# Patient Record
Sex: Female | Born: 1975 | Hispanic: Yes | Marital: Married | Smoking: Never smoker
Health system: Southern US, Community
[De-identification: ages and names within clinical notes are randomized; demographics above are authoritative.]

## PROBLEM LIST (undated history)

## (undated) DIAGNOSIS — F419 Anxiety disorder, unspecified: Secondary | ICD-10-CM

## (undated) DIAGNOSIS — F32A Depression, unspecified: Secondary | ICD-10-CM

## (undated) DIAGNOSIS — T7840XA Allergy, unspecified, initial encounter: Secondary | ICD-10-CM

## (undated) DIAGNOSIS — F329 Major depressive disorder, single episode, unspecified: Secondary | ICD-10-CM

## (undated) HISTORY — DX: Major depressive disorder, single episode, unspecified: F32.9

## (undated) HISTORY — DX: Anxiety disorder, unspecified: F41.9

## (undated) HISTORY — DX: Depression, unspecified: F32.A

## (undated) HISTORY — DX: Allergy, unspecified, initial encounter: T78.40XA

---

## 2015-03-07 ENCOUNTER — Encounter (HOSPITAL_COMMUNITY): Payer: Self-pay | Admitting: *Deleted

## 2015-03-07 ENCOUNTER — Emergency Department (HOSPITAL_COMMUNITY)
Admission: EM | Admit: 2015-03-07 | Discharge: 2015-03-07 | Disposition: A | Discharge status: Home / Self Care | Payer: No Typology Code available for payment source | Attending: Emergency Medicine | Admitting: Emergency Medicine

## 2015-03-07 DIAGNOSIS — S199XXA Unspecified injury of neck, initial encounter: Secondary | ICD-10-CM | POA: Insufficient documentation

## 2015-03-07 DIAGNOSIS — Y9389 Activity, other specified: Secondary | ICD-10-CM | POA: Insufficient documentation

## 2015-03-07 DIAGNOSIS — S3992XA Unspecified injury of lower back, initial encounter: Secondary | ICD-10-CM | POA: Insufficient documentation

## 2015-03-07 DIAGNOSIS — S0990XA Unspecified injury of head, initial encounter: Secondary | ICD-10-CM | POA: Insufficient documentation

## 2015-03-07 DIAGNOSIS — Y998 Other external cause status: Secondary | ICD-10-CM | POA: Insufficient documentation

## 2015-03-07 DIAGNOSIS — Y92410 Unspecified street and highway as the place of occurrence of the external cause: Secondary | ICD-10-CM | POA: Insufficient documentation

## 2015-03-07 MED ORDER — NAPROXEN 500 MG PO TABS: 500.0000 mg | ORAL_TABLET | Freq: Two times a day (BID) | ORAL | Status: DC

## 2015-03-07 MED ORDER — METHOCARBAMOL 500 MG PO TABS: 500.0000 mg | ORAL_TABLET | Freq: Two times a day (BID) | ORAL | Status: DC

## 2015-03-07 NOTE — ED Notes (Signed)
Pt A&OX4, ambulatory at d/c with steady gait, NAD 
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
## 2015-05-20 ENCOUNTER — Encounter (HOSPITAL_COMMUNITY): Payer: Self-pay | Admitting: Emergency Medicine

## 2015-05-20 ENCOUNTER — Emergency Department (HOSPITAL_COMMUNITY)
Admission: EM | Admit: 2015-05-20 | Discharge: 2015-05-20 | Disposition: A | Discharge status: Home / Self Care | Payer: No Typology Code available for payment source | Attending: Emergency Medicine | Admitting: Emergency Medicine

## 2015-05-20 DIAGNOSIS — M545 Low back pain: Secondary | ICD-10-CM | POA: Diagnosis not present

## 2015-05-20 DIAGNOSIS — Z791 Long term (current) use of non-steroidal anti-inflammatories (NSAID): Secondary | ICD-10-CM | POA: Diagnosis not present

## 2015-05-20 DIAGNOSIS — M542 Cervicalgia: Secondary | ICD-10-CM | POA: Diagnosis not present

## 2015-05-20 DIAGNOSIS — M546 Pain in thoracic spine: Secondary | ICD-10-CM | POA: Diagnosis not present

## 2015-05-20 MED ORDER — DICLOFENAC SODIUM 75 MG PO TBEC: 75.0000 mg | DELAYED_RELEASE_TABLET | Freq: Two times a day (BID) | ORAL | Status: DC

## 2015-05-20 MED ORDER — METHOCARBAMOL 500 MG PO TABS: 500.0000 mg | ORAL_TABLET | Freq: Two times a day (BID) | ORAL | Status: DC

## 2015-05-20 NOTE — Discharge Instructions (Signed)
Back Pain, Adult Low back pain is very common. About 1 in 5 people have back pain.The cause of low back pain is rarely dangerous. The pain often gets better over time.About half of people with a sudden onset of back pain feel better in just 2 weeks. About 8 in 10 people feel better by 6 weeks.  CAUSES Some common causes of back pain include:  Strain of the muscles or ligaments supporting the spine.  Wear and tear (degeneration) of the spinal discs.  Arthritis.  Direct injury to the back. DIAGNOSIS Most of the time, the direct cause of low back pain is not known.However, back pain can be treated effectively even when the exact cause of the pain is unknown.Answering your caregiver's questions about your overall health and symptoms is one of the most accurate ways to make sure the cause of your pain is not dangerous. If your caregiver needs more information, he or she may order lab work or imaging tests (X-rays or MRIs).However, even if imaging tests show changes in your back, this usually does not require surgery. HOME CARE INSTRUCTIONS For many people, back pain returns.Since low back pain is rarely dangerous, it is often a condition that people can learn to manageon their own.   Remain active. It is stressful on the back to sit or stand in one place. Do not sit, drive, or stand in one place for more than 30 minutes at a time. Take short walks on level surfaces as soon as pain allows.Try to increase the length of time you walk each day.  Do not stay in bed.Resting more than 1 or 2 days can delay your recovery.  Do not avoid exercise or work.Your body is made to move.It is not dangerous to be active, even though your back may hurt.Your back will likely heal faster if you return to being active before your pain is gone.  Pay attention to your body when you bend and lift. Many people have less discomfortwhen lifting if they bend their knees, keep the load close to their bodies,and  avoid twisting. Often, the most comfortable positions are those that put less stress on your recovering back.  Find a comfortable position to sleep. Use a firm mattress and lie on your side with your knees slightly bent. If you lie on your back, put a pillow under your knees.  Only take over-the-counter or prescription medicines as directed by your caregiver. Over-the-counter medicines to reduce pain and inflammation are often the most helpful.Your caregiver may prescribe muscle relaxant drugs.These medicines help dull your pain so you can more quickly return to your normal activities and healthy exercise.  Put ice on the injured area.  Put ice in a plastic bag.  Place a towel between your skin and the bag.  Leave the ice on for 15-20 minutes, 03-04 times a day for the first 2 to 3 days. After that, ice and heat may be alternated to reduce pain and spasms.  Ask your caregiver about trying back exercises and gentle massage. This may be of some benefit.  Avoid feeling anxious or stressed.Stress increases muscle tension and can worsen back pain.It is important to recognize when you are anxious or stressed and learn ways to manage it.Exercise is a great option. SEEK MEDICAL CARE IF:  You have pain that is not relieved with rest or medicine.  You have pain that does not improve in 1 week.  You have new symptoms.  You are generally not feeling well. SEEK   IMMEDIATE MEDICAL CARE IF:   You have pain that radiates from your back into your legs.  You develop new bowel or bladder control problems.  You have unusual weakness or numbness in your arms or legs.  You develop nausea or vomiting.  You develop abdominal pain.  You feel faint. Document Released: 10/25/2005 Document Revised: 04/25/2012 Document Reviewed: 02/26/2014 ExitCare Patient Information 2015 ExitCare, LLC. This information is not intended to replace advice given to you by your health care provider. Make sure you  discuss any questions you have with your health care provider.  
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
## 2015-06-18 ENCOUNTER — Ambulatory Visit: Payer: No Typology Code available for payment source | Attending: Orthopaedic Surgery

## 2015-06-18 DIAGNOSIS — M546 Pain in thoracic spine: Secondary | ICD-10-CM | POA: Diagnosis present

## 2015-06-18 DIAGNOSIS — M2569 Stiffness of other specified joint, not elsewhere classified: Secondary | ICD-10-CM

## 2015-06-18 DIAGNOSIS — M2560 Stiffness of unspecified joint, not elsewhere classified: Secondary | ICD-10-CM | POA: Diagnosis present

## 2015-06-18 NOTE — Patient Instructions (Signed)
BACK: Child's Pose (Sciatica)   Sintese en la posicin rodillas al pecho y extienda los brazos hacia adelante. Separe las rodillas para mas comodidad. Sostenga la posicin durante 20___ respiraciones. Repita _3__ veces. Realice _3__ veces al da.    Side Waist Stretch from Child's Pose  From child's pose, walk hands to left. Reach right hand out on diagonal. Reach hips back toward heels making a C with torso. Breathe into right side waist. Hold for _20___ breaths. Repeat _3___ times each side.  1-2 times a day.  Copyright  VHI. All rights reserved.   [facility] [address] [place], [place] [address] Phone # [phone] Fax [phone]
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
## 2015-06-19 ENCOUNTER — Ambulatory Visit: Payer: No Typology Code available for payment source | Admitting: Physical Therapy

## 2015-06-19 ENCOUNTER — Encounter: Payer: Self-pay | Admitting: Physical Therapy

## 2015-06-19 DIAGNOSIS — M546 Pain in thoracic spine: Secondary | ICD-10-CM | POA: Diagnosis not present

## 2015-06-19 DIAGNOSIS — M2569 Stiffness of other specified joint, not elsewhere classified: Secondary | ICD-10-CM

## 2015-06-19 DIAGNOSIS — M2560 Stiffness of unspecified joint, not elsewhere classified: Secondary | ICD-10-CM
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
## 2015-06-19 NOTE — Patient Instructions (Addendum)
Thoracic Self-Mobilization (Sitting)   With small rolled towel at lower ribs level, gently lean back until stretch is felt. Hold 5 seconds. Relax. Repeat   10 times per set. Do   3 sets per session. Do  2 -3  sessions per day.  http://orth.exer.us/998   Copyright  VHI. All rights reserved.  Thoracic Self-Mobilization Stretch (Supine)   With small rolled towel at lower ribs level, (in BRA area) gently lie back until stretch is felt. Hold  . Relax. Repeat  X 1 times per set. Do 2 x per day. Have your arms at the side.  http://orth.exer.us/994   Copyright  VHI. All rights reserved.   Cervico-Thoracic: Extension / Rotation (Sitting)   Reach across body with left arm and grasp back of chair. Gently look over right side shoulder. Hold __20__ seconds. Relax. Repeat ___3_ times per set. Do _1___ sets per session. Do __3__ sessions per day.  Copyright  VHI. All rights reserved.     Lumbar Rotation: Caudal - Bilateral (Supine)   Feet and knees together, arms outstretched, rotate knees left, turning head in opposite direction, until stretch is felt. Hold _20___ seconds. Relax. Repeat __3__ times per set. Do _1___ sets per session. Do _3___ sessions per day.  http://orth.exer.us/1020   Copyright  VHI. All rights reserved.

## 2015-06-23 ENCOUNTER — Ambulatory Visit: Payer: No Typology Code available for payment source | Admitting: Physical Therapy

## 2015-06-30 ENCOUNTER — Ambulatory Visit: Payer: No Typology Code available for payment source

## 2015-06-30 DIAGNOSIS — M2560 Stiffness of unspecified joint, not elsewhere classified: Secondary | ICD-10-CM

## 2015-06-30 DIAGNOSIS — M2569 Stiffness of other specified joint, not elsewhere classified: Secondary | ICD-10-CM

## 2015-06-30 DIAGNOSIS — M546 Pain in thoracic spine: Secondary | ICD-10-CM | POA: Diagnosis not present

## 2015-06-30 NOTE — Therapy (Signed)
[facility] [address] [place], [place], [address] Phone: [phone]   Fax:  [phone]  Physical Therapy Treatment  Patient Details  Name: Nishat Livingston MRN: [id number] Date of Birth: [birth year] Referring Provider:  Tarry Kos, MD  Encounter Date: 06/30/2015      PT End of Session - 06/30/15 0834    Visit Number 3   Date for PT Re-Evaluation 08/13/15   PT Start Time 0800   PT Stop Time 0855   PT Time Calculation (min) 55 min   Activity Tolerance Patient tolerated treatment well   Behavior During Therapy [facility] for tasks assessed/performed      History reviewed. No pertinent past medical history.  History reviewed. No pertinent past surgical history.  There were no vitals filed for this visit.  Visit Diagnosis:  Bilateral thoracic back pain  Back stiffness      Subjective Assessment - 06/30/15 0804    Subjective Exercises are going well.  Muscles in the thoracic spine feel more relaxed now.     Patient is accompained by: Interpreter   Currently in Pain? Yes   Pain Score 2    Pain Location Back   Pain Orientation Right;Left;Mid   Pain Descriptors / Indicators Sore;Tingling   Pain Type Chronic pain   Pain Onset More than a month ago   Pain Frequency Constant   Aggravating Factors  work Nature conservation officer), cooking   Pain Relieving Factors medication, sitting upright                         [facility] Adult PT Treatment/Exercise - 06/30/15 0001    Lumbar Exercises: Stretches   Lower Trunk Rotation 3 reps;20 seconds  each side   Lumbar Exercises: Aerobic   UBE (Upper Arm Bike) Level 1 seated with good posture x 6 minutes (3/3)   Lumbar Exercises: Supine   Other Supine Lumbar Exercises Foam roll x 3 min   Lumbar Exercises: Prone   Other Prone Lumbar Exercises Prayer and lateral prayer stretch 3x20 seconds each   Shoulder Exercises: Seated   Horizontal ABduction Strengthening;Both;20 reps   Theraband Level (Shoulder Horizontal ABduction) Level 1 (Yellow)   Other Seated Exercises thoracolumbar flexibility in chair 3x20 seconds   Modalities   Modalities Moist Heat   Moist Heat Therapy   Number Minutes Moist Heat 15 Minutes   Moist Heat Location Other (comment)  thoracic   Manual Therapy   Manual Therapy Joint mobilization   Joint Mobilization PA glides T3-T10 grade II-III to pt tolerance                  PT Short Term Goals - 06/30/15 0805    PT SHORT TERM GOAL #1   Title be independent in intial HEP   Time 4   Period Weeks   Status Achieved   PT SHORT TERM GOAL #2   Title report a 30% reduction in thoracic pain with work tasks   Time 4   Period Weeks   Status On-going  10% reduction           PT Long Term Goals - 06/18/15 5284    PT LONG TERM GOAL #1   Title be independent in advanced HEP   Time 8   Period Weeks   Status New   PT LONG TERM GOAL #2   Title reduce FOTO to < or = to 29% limitation   Time 8   Period  Weeks   Status New   PT LONG TERM GOAL #3   Title report a 70% reduction in thoracic pain with work tasks   Time 8   Period Weeks   Status New   PT LONG TERM GOAL #4   Title cook with < or = to 3/10 thoracic pain   Time 8   Period Weeks   Status New               Plan - 06/30/15 1610    Clinical Impression Statement Pt reports 10% reduction in overall pain and reports that muscles in mid-back feel more relaxed.  Pt with continued thoracic pain especially with work tasks.  Pt demonstrates stiffness in the thoracic spine and painful mobility.  Pt will benefit from skilled PT for spinal decompression, flexibility, postural strength and pain management as needed.     Pt will benefit from skilled therapeutic intervention in order to improve on the following deficits Pain;Increased muscle spasms;Impaired flexibility   Rehab Potential Good   PT Frequency 2x / week   PT Duration 8 weeks   PT Treatment/Interventions ADLs/Self  Care Home Management;Cryotherapy;Electrical Stimulation;Functional mobility training;Ultrasound;Moist Heat;Therapeutic activities;Therapeutic exercise;Neuromuscular re-education;Patient/family education;Passive range of motion;Manual techniques   PT Next Visit Plan PA glides to thoracic spine, demcompression, thoracic strength and flexibility, modalities PRN   Consulted and Agree with Plan of Care Patient        Problem List There are no active problems to display for this patient.   TAKACS,KELLY, PT 06/30/2015, 8:39 AM  [facility] [address] [place], [place], [address] Phone: [phone]   Fax:  [phone]

## 2015-07-02 ENCOUNTER — Ambulatory Visit: Payer: No Typology Code available for payment source

## 2015-07-02 DIAGNOSIS — M546 Pain in thoracic spine: Secondary | ICD-10-CM | POA: Diagnosis not present

## 2015-07-02 DIAGNOSIS — M2560 Stiffness of unspecified joint, not elsewhere classified: Secondary | ICD-10-CM

## 2015-07-02 DIAGNOSIS — M2569 Stiffness of other specified joint, not elsewhere classified: Secondary | ICD-10-CM
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
## 2015-07-02 NOTE — Patient Instructions (Addendum)
Do this seated on on your back (easier on your back)    PNF Strengthening: Resisted   Standing with resistive band around each hand, bring right arm up and away, thumb back. Repeat _10___ times per set. Do _2___ sets per session. Do _1-2___ sessions per day.      Resisted Horizontal Abduction: Bilateral   Sit or stand, tubing in both hands, arms out in front. Keeping arms straight, pinch shoulder blades together and stretch arms out. Repeat _10___ times per set. Do 2____ sets per session. Do _1-2___ sessions per day.   [facility] [address] [place], [place] [address] Phone # [phone] Fax [phone]

## 2015-07-07 ENCOUNTER — Ambulatory Visit: Payer: No Typology Code available for payment source | Admitting: Physical Therapy

## 2015-07-07 ENCOUNTER — Encounter: Payer: Self-pay | Admitting: Physical Therapy

## 2015-07-07 DIAGNOSIS — M2560 Stiffness of unspecified joint, not elsewhere classified: Secondary | ICD-10-CM

## 2015-07-07 DIAGNOSIS — M2569 Stiffness of other specified joint, not elsewhere classified: Secondary | ICD-10-CM

## 2015-07-07 DIAGNOSIS — M546 Pain in thoracic spine: Secondary | ICD-10-CM
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
## 2015-07-10 ENCOUNTER — Ambulatory Visit: Payer: No Typology Code available for payment source | Attending: Orthopaedic Surgery

## 2015-07-10 DIAGNOSIS — M2560 Stiffness of unspecified joint, not elsewhere classified: Secondary | ICD-10-CM | POA: Diagnosis present

## 2015-07-10 DIAGNOSIS — M2569 Stiffness of other specified joint, not elsewhere classified: Secondary | ICD-10-CM

## 2015-07-10 DIAGNOSIS — M546 Pain in thoracic spine: Secondary | ICD-10-CM | POA: Diagnosis present

## 2015-07-10 NOTE — Therapy (Signed)
[facility] [address], [address] [place], [place], [address] Phone: [phone]   Fax:  [phone]  Physical Therapy Treatment  Patient Details  Name: Connie Li MRN: [id number] Date of Birth: [birth year] Referring Provider:  Tarry Kos, MD  Encounter Date: 07/10/2015      PT End of Session - 07/10/15 0920    Visit Number 6   Date for PT Re-Evaluation 08/13/15   PT Start Time 0846   PT Stop Time 0918   PT Time Calculation (min) 32 min   Activity Tolerance Patient tolerated treatment well   Behavior During Therapy [facility] for tasks assessed/performed      History reviewed. No pertinent past medical history.  History reviewed. No pertinent past surgical history.  There were no vitals filed for this visit.  Visit Diagnosis:  Bilateral thoracic back pain  Back stiffness      Subjective Assessment - 07/10/15 0857    Subjective No pain today.  Pt reports >90% improvement in symptoms since the start of care.     Patient is accompained by: Interpreter   Currently in Pain? No/denies                         [facility] Adult PT Treatment/Exercise - 07/10/15 0001    Lumbar Exercises: Aerobic   UBE (Upper Arm Bike) Level 1 seated with good posture x 8 minutes (4/4)   Lumbar Exercises: Supine   Other Supine Lumbar Exercises Foam roll x 3 min   Shoulder Exercises: Seated   Horizontal ABduction Strengthening;Both;20 reps   Theraband Level (Shoulder Horizontal ABduction) Level 2 (Red)   Flexion Strengthening;Both;20 reps;Weights   Flexion Weight (lbs) 1   Abduction Strengthening;20 reps;Weights  scaption 1# x 10   ABduction Weight (lbs) 1   Other Seated Exercises D2 with red band 2x10 each   Manual Therapy   Manual Therapy Joint mobilization   Joint Mobilization PA glides T3-T10 grade II-III to pt tolerance                  PT Short Term Goals - 07/07/15 0854    PT SHORT TERM GOAL #1   Title be  independent in intial HEP   Time 4   Period Weeks   Status Achieved   PT SHORT TERM GOAL #2   Title report a 30% reduction in thoracic pain with work tasks   Time 4   Period Weeks   Status On-going           PT Long Term Goals - 07/07/15 0856    PT LONG TERM GOAL #1   Title be independent in advanced HEP   Time 8   Period Weeks   Status On-going   PT LONG TERM GOAL #2   Title reduce FOTO to < or = to 29% limitation   Time 8   Period Weeks   Status On-going   PT LONG TERM GOAL #3   Title report a 70% reduction in thoracic pain with work tasks   Time 8   Period Weeks   Status On-going   PT LONG TERM GOAL #4   Title cook with < or = to 3/10 thoracic pain   Time 8   Period Weeks   Status On-going               Plan - 07/10/15 0903    Clinical Impression Statement Pt denies pain over the past  2 days.  Pt reports >90% improvement in symptoms since the start of care.  Pt was able to tolerate increased time on the arm bike and increased resistance with theraband with horizontal abduction and D2.  Pt with continued stiffness in the thoracic spine.  Pt will benefit from PT for strength and flexibility.     Pt will benefit from skilled therapeutic intervention in order to improve on the following deficits Pain;Increased muscle spasms;Impaired flexibility   Rehab Potential Good   PT Frequency 2x / week   PT Duration 8 weeks   PT Treatment/Interventions ADLs/Self Care Home Management;Cryotherapy;Electrical Stimulation;Functional mobility training;Ultrasound;Moist Heat;Therapeutic activities;Therapeutic exercise;Neuromuscular re-education;Patient/family education;Passive range of motion;Manual techniques   PT Next Visit Plan PA glides to thoracic spine, demcompression, thoracic strength and flexibility, modalities PRN. D/C next week if pt is still doing well.     Consulted and Agree with Plan of Care Patient        Problem List There are no active problems to display  for this patient.   Manasa Spease, PT 07/10/2015, 9:23 AM  [facility] [address] [place], [place], [address] Phone: [phone]   Fax:  [phone]

## 2015-07-15 ENCOUNTER — Ambulatory Visit: Payer: No Typology Code available for payment source

## 2015-07-15 DIAGNOSIS — M546 Pain in thoracic spine: Secondary | ICD-10-CM

## 2015-07-15 DIAGNOSIS — M2560 Stiffness of unspecified joint, not elsewhere classified: Secondary | ICD-10-CM

## 2015-07-15 DIAGNOSIS — M2569 Stiffness of other specified joint, not elsewhere classified: Secondary | ICD-10-CM

## 2015-07-15 NOTE — Therapy (Signed)
[facility] [address] [place], [place], [address] Phone: [phone]   Fax:  [phone]  Physical Therapy Treatment  Patient Details  Name: Connie Li MRN: [id number] Date of Birth: 1976/06/21 Referring Provider:  Tarry Kos, MD  Encounter Date: 07/15/2015      PT End of Session - 07/15/15 0839    Visit Number 7   Date for PT Re-Evaluation 08/13/15   PT Start Time 0800   PT Stop Time 0839   PT Time Calculation (min) 39 min   Activity Tolerance Patient tolerated treatment well   Behavior During Therapy [facility] for tasks assessed/performed      History reviewed. No pertinent past medical history.  History reviewed. No pertinent past surgical history.  There were no vitals filed for this visit.  Visit Diagnosis:  Bilateral thoracic back pain  Back stiffness      Subjective Assessment - 07/15/15 0804    Subjective Pt continues to feel >90% overall improvement.  Had a good weekend.     Currently in Pain? Yes   Pain Score 2    Pain Location Back   Pain Orientation Right;Left;Upper   Pain Descriptors / Indicators Sore   Pain Type Chronic pain   Pain Onset More than a month ago   Pain Frequency Constant   Aggravating Factors  Just woke up with soreness.     Pain Relieving Factors medication, sitting upright            [facility] PT Assessment - 07/15/15 0001    Observation/Other Assessments   Focus on Therapeutic Outcomes (FOTO)  42% limitation                     [facility] Adult PT Treatment/Exercise - 07/15/15 0001    Lumbar Exercises: Aerobic   UBE (Upper Arm Bike) Level 1 seated with good posture x 8 minutes (4/4)   Shoulder Exercises: Seated   Horizontal ABduction Strengthening;Both;20 reps   Theraband Level (Shoulder Horizontal ABduction) Level 2 (Red)   Flexion Strengthening;Both;20 reps;Weights   Flexion Weight (lbs) 1   Abduction Strengthening;20 reps;Weights  scaption 1# x 10    ABduction Weight (lbs) 1   Other Seated Exercises D2 with red band 2x10 each   Manual Therapy   Manual Therapy Joint mobilization   Joint Mobilization PA glides T3-T10 grade II-III to pt tolerance                  PT Short Term Goals - 07/07/15 0854    PT SHORT TERM GOAL #1   Title be independent in intial HEP   Time 4   Period Weeks   Status Achieved   PT SHORT TERM GOAL #2   Title report a 30% reduction in thoracic pain with work tasks   Time 4   Period Weeks   Status On-going           PT Long Term Goals - 07/15/15 0806    PT LONG TERM GOAL #3   Title report a 70% reduction in thoracic pain with work tasks   Status Achieved   PT LONG TERM GOAL #4   Title cook with < or = to 3/10 thoracic pain   Status Achieved               Plan - 07/15/15 0820    Clinical Impression Statement Pt continues to report >90% overall improvement in symptoms.  FOTO score is 42% limitation.  Pt with stiffness at T5-7 with PA mobs today.  Pt denies any functional limitations.  1 more session to finalize HEP.   Rehab Potential Good   PT Frequency 2x / week   PT Duration 8 weeks   PT Treatment/Interventions ADLs/Self Care Home Management;Cryotherapy;Electrical Stimulation;Functional mobility training;Ultrasound;Moist Heat;Therapeutic activities;Therapeutic exercise;Neuromuscular re-education;Patient/family education;Passive range of motion;Manual techniques   PT Next Visit Plan Plan D/C to HEP next session.   D/C FOTO.          Problem List There are no active problems to display for this patient.   TAKACS,KELLY, PT 07/15/2015, 8:40 AM  [facility] [address], [address] [place], [place], [address] Phone: [phone]   Fax:  [phone]

## 2015-07-17 ENCOUNTER — Ambulatory Visit: Payer: No Typology Code available for payment source

## 2015-07-17 DIAGNOSIS — M2569 Stiffness of other specified joint, not elsewhere classified: Secondary | ICD-10-CM

## 2015-07-17 DIAGNOSIS — M546 Pain in thoracic spine: Secondary | ICD-10-CM | POA: Diagnosis not present

## 2015-07-17 DIAGNOSIS — M2560 Stiffness of unspecified joint, not elsewhere classified: Secondary | ICD-10-CM
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
## 2015-07-23 ENCOUNTER — Encounter: Payer: Self-pay | Admitting: Physical Therapy

## 2015-07-25 ENCOUNTER — Encounter: Payer: Self-pay | Admitting: Physical Therapy

## 2017-07-18 DIAGNOSIS — R51 Headache: Secondary | ICD-10-CM | POA: Diagnosis not present

## 2017-07-18 DIAGNOSIS — M546 Pain in thoracic spine: Secondary | ICD-10-CM | POA: Diagnosis not present

## 2017-07-27 ENCOUNTER — Ambulatory Visit (HOSPITAL_BASED_OUTPATIENT_CLINIC_OR_DEPARTMENT_OTHER)
Admission: RE | Admit: 2017-07-27 | Discharge: 2017-07-27 | Disposition: A | Discharge status: Home / Self Care | Payer: BLUE CROSS/BLUE SHIELD | Source: Ambulatory Visit | Attending: Medical | Admitting: Medical

## 2017-07-27 ENCOUNTER — Ambulatory Visit (INDEPENDENT_AMBULATORY_CARE_PROVIDER_SITE_OTHER): Payer: BLUE CROSS/BLUE SHIELD | Admitting: Medical

## 2017-07-27 ENCOUNTER — Encounter: Payer: Self-pay | Admitting: Medical

## 2017-07-27 ENCOUNTER — Telehealth: Payer: Self-pay | Admitting: Medical

## 2017-07-27 VITALS — BP 118/64 | HR 62 | Temp 98.2°F | Resp 16 | Ht 63.0 in | Wt 148.2 lb

## 2017-07-27 DIAGNOSIS — M546 Pain in thoracic spine: Secondary | ICD-10-CM

## 2017-07-27 DIAGNOSIS — G44209 Tension-type headache, unspecified, not intractable: Secondary | ICD-10-CM

## 2017-07-27 DIAGNOSIS — G629 Polyneuropathy, unspecified: Secondary | ICD-10-CM

## 2017-07-27 MED ORDER — CYCLOBENZAPRINE HCL 5 MG PO TABS: ORAL_TABLET | ORAL | 1 refills | Status: AC

## 2017-07-27 MED ORDER — DICLOFENAC SODIUM 75 MG PO TBEC: 75.0000 mg | DELAYED_RELEASE_TABLET | Freq: Two times a day (BID) | ORAL | 0 refills | Status: AC

## 2017-07-27 NOTE — Telephone Encounter (Signed)
error 

## 2017-07-27 NOTE — Progress Notes (Signed)
Subjective:    Patient ID: Connie Li, female    DOB: 1976/03/21, 41 y.o.   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 Not married.  Pt describes some mid thoracic pain and some pain on sides of her back. Pain for 10 days. Pain currently is mild. Pt states no change in pain with movements. Pt states when her back pain worsens and is strong will cause nausea. But never vomited. Pt went to urgent care and told may have stress. No chest pain with back pain. Pt given prednisone and zanaflex. She states she thought these dropped with med. But she did not check bp. But did feel very sleepy and dizzy. Pain level presently low 1-2/10.  Pt at time feels like for years has on and off numbness of rt lateral thigh. But no back pain. No leg weakness. Then also occasional mild sharp pain to her eyes but not vision changes. Eye discomfort only for seconds.  Pt at times feels like has sensation of ants crawling on lips. Last time was 3 days. Last 20 minutes but no associated ha or gross motor or sensory function deficit.went away randomly.  Pt has history of occasional mild ha with rt sided trapezius pain. Pt takes advil and will stop ha. No gross motor or sensory functionn deficits associated with ha as well. No ha presently.  LMP- today.   Review of Systems  Constitutional: Negative for chills, fatigue and fever.  HENT: Negative for congestion, ear discharge, ear pain, facial swelling, mouth sores, nosebleeds, postnasal drip, rhinorrhea, sinus pain, sinus pressure, sore throat and tinnitus.   Respiratory: Negative for cough, chest tightness and shortness of breath.   Cardiovascular: Negative for chest pain and palpitations.  Gastrointestinal: Negative for abdominal pain, anal bleeding, blood in stool, constipation,  diarrhea, nausea, rectal pain and vomiting.  Genitourinary: Negative for dyspareunia, flank pain, genital sores, menstrual problem, pelvic pain and urgency.  Musculoskeletal: Positive for back pain. Negative for arthralgias, joint swelling, myalgias and neck pain.       See hpi on rt thigh area symptoms.  Skin: Negative for rash.  Neurological: Positive for headaches. Negative for dizziness, speech difficulty and numbness.       See hpi on headache and trap pain.  Hematological: Negative for adenopathy. Does not bruise/bleed easily.  Psychiatric/Behavioral: Negative for behavioral problems, confusion, dysphoric mood and self-injury. The patient is not nervous/anxious.     Past Medical History:  Diagnosis Date  . Allergy    more in spring and some in fall.  . Anxiety   . Depression      Social History   Social History  . Marital status: Married    Spouse name: N/A  . Number of children: N/A  . Years of education: N/A   Occupational History  . Not on file.   Social History Main Topics  . Smoking status: Never Smoker  . Smokeless tobacco: Never Used  . Alcohol use No  . Drug use: No  . Sexual activity: No   Other Topics Concern  . Not on file   Social History Narrative  . No narrative on file    Past Surgical History:  Procedure Laterality Date  . CESAREAN SECTION      History reviewed. No  pertinent family history.  No Known Allergies  Current Outpatient Prescriptions on File Prior to Visit  Medication Sig Dispense Refill  . diclofenac (VOLTAREN) 75 MG EC tablet Take 1 tablet (75 mg total) by mouth 2 (two) times daily. 20 tablet 0  . methocarbamol (ROBAXIN) 500 MG tablet Take 1 tablet (500 mg total) by mouth 2 (two) times daily. 20 tablet 0  . naproxen (NAPROSYN) 500 MG tablet Take 1 tablet (500 mg total) by mouth 2 (two) times daily. 20 tablet 0   No current facility-administered medications on file prior to visit.     BP 118/64   Pulse 62   Temp 98.2 F  (36.8 C) (Oral)   Resp 16   Ht  (1.6 m)   Wt 148 lb 3.2 oz (67.2 kg)   SpO2 100%   BMI 26.25 kg/m       Objective:   Physical Exam   General Mental Status- Alert. General Appearance- Not in acute distress.   Skin General: Color- Normal Color. Moisture- Normal Moisture.  Neck Carotid Arteries- Normal color. Moisture- Normal Moisture. No carotid bruits. No JVD. Faint rt si tenderness to palpation.  Chest and Lung Exam Auscultation: Breath Sounds:-Normal.  Cardiovascular Auscultation:Rythm- Regular. Murmurs & Other Heart Sounds:Auscultation of the heart reveals- No Murmurs.  Abdomen Inspection:-Inspeection Normal. Palpation/Percussion:Note:No mass. Palpation and Percussion of the abdomen reveal- Non Tender, Non Distended + BS, no rebound or guarding.  Back- fait mid tspine pain. Mild parathoracic area pain bilaterally. Neurologic Cranial Nerve exam:- CN III-XII intact(No nystagmus), symmetric smile. Finger to Nose:- Normal/Intact Strength:- 5/5 equal and symmetric strength both upper and lower extremities.     Assessment & Plan:  For your recent thoracic region back pain, I will order thoracic spine x-ray. I will prescribe diclofenac NSAID  and low dose Flexeril. Specifically only use Flexeril at night.  For recent tension headache, I think the anti-inflammatory and Flexeril will help as well. For any severe headache with neurologic signs/symptoms as discussed then be seen in the emergency department.  Your other symptoms of transient right thigh numbness, transient bilateral eye pain, and tingling of the lips is hard to explain completely. Sometimes anxiety, stress or depression can cause various symptoms. However some of these symptoms may be neurologic and will need to follow you closely. Particularly I symptoms could represent optic neuritis and if the symptoms are persisting will refer you to neurologist. Also we'll can to needed moderate sure your mood as you  do admit history of anxiety and depression in the past.   Please go ahead and schedule complete physical exam in 2 weeks. Come in fasting. On the physical exam, I am intending to add B12, B1 and folate to the standard [facility] labs.  Makennah Omura, Ramon Dredge, PA-C

## 2017-07-27 NOTE — Patient Instructions (Signed)
For your recent thoracic region back pain, I will order thoracic spine x-ray. I will prescribe diclofenac NSAID  and low dose Flexeril. Specifically only use Flexeril at night.  For recent tension headache, I think the anti-inflammatory and Flexeril will help as well. For any severe headache with neurologic signs/symptoms as discussed then be seen in the emergency department.  Your other symptoms of transient right thigh numbness, transient bilateral eye pain, and tingling of the lips is hard to explain completely. Sometimes anxiety, stress or depression can cause various symptoms. However some of these symptoms may be neurologic and will need to follow you closely. Particularly I symptoms could represent optic neuritis and if the symptoms are persisting will refer you to neurologist. Also we'll can to needed moderate sure your mood as you do admit history of anxiety and depression in the past.   Please go ahead and schedule complete physical exam in 2 weeks. Come in fasting. On the physical exam, I am intending to add B12, B1 and folate to the standard [facility] labs.

## 2017-07-29 ENCOUNTER — Telehealth: Payer: Self-pay | Admitting: Medical

## 2017-07-29 NOTE — Telephone Encounter (Signed)
-----   Message from Esperanza Richters, PA-C sent at 07/27/2017  6:25 PM EDT ----- Patient is a [place] speaker. Please notify her that the x-ray was normal. I think most of her pain is likely muscular.

## 2017-07-29 NOTE — Telephone Encounter (Signed)
LVM for pt to return call

## 2017-08-03 NOTE — Telephone Encounter (Signed)
Pt's boyfriend return call [place] on [facility]) and was informed about pts results. Boyfriend understood and will communicate information to pt.

## 2017-08-10 ENCOUNTER — Ambulatory Visit: Payer: BLUE CROSS/BLUE SHIELD | Admitting: Medical

## 2017-08-11 ENCOUNTER — Ambulatory Visit (INDEPENDENT_AMBULATORY_CARE_PROVIDER_SITE_OTHER): Payer: BLUE CROSS/BLUE SHIELD | Admitting: Medical

## 2017-08-11 VITALS — BP 107/70 | HR 67 | Temp 99.2°F | Resp 16 | Ht 63.0 in | Wt 150.0 lb

## 2017-08-11 DIAGNOSIS — Z113 Encounter for screening for infections with a predominantly sexual mode of transmission: Secondary | ICD-10-CM | POA: Diagnosis not present

## 2017-08-11 DIAGNOSIS — Z0000 Encounter for general adult medical examination without abnormal findings: Secondary | ICD-10-CM | POA: Diagnosis not present

## 2017-08-11 DIAGNOSIS — R5383 Other fatigue: Secondary | ICD-10-CM | POA: Diagnosis not present

## 2017-08-11 NOTE — Patient Instructions (Addendum)
For you wellness exam today I have ordered cbc, cmp, tsh, lipid panel, ua, b12, vit d and hiv.   Vaccine declined.(can reconsider and if you change mind let us know) flu vaccine and tdap  Recommend exercise and healthy diet.  We will let you know lab results as they come in.  Follow up date appointment will be determined after lab review.    Cuidados preventivos en las mujeres de entre 12 y [phone] (Preventive Care 40-64 Years, Female) Los cuidados preventivos hacen referencia a las opciones en cuanto al estilo de vida y a las visitas al mdico, las cuales pueden promover la salud y Musician. QU INCLUYEN LOS CUIDADOS PREVENTIVOS?  Un examen fsico anual. Tambin se lo conoce como control de rutina anual.  Exmenes dentales una o dos veces al ao.  Exmenes oculares de [place]. Pregntele al mdico con qu frecuencia debe hacerse controles oculares.  Opciones personales en cuanto al estilo de vida, entre ellas: ? [place] y las piezas dentales. ? Realizar actividad fsica con regularidad. ? Consumir una dieta saludable. ? Evitar el consumo de tabaco y de drogas. ? Limitar el consumo de bebidas alcohlicas. ? Counsellor. ? Tomar aspirina de baja dosis diariamente a [employer]. ? Tomar los suplementos vitamnicos y [employer] se lo haya indicado el mdico.  QU SUCEDE DURANTE UN CONTROL DE Coffee? Los servicios y las pruebas de deteccin que el mdico realiza durante un control de rutina anual dependern de la edad, el estado de salud general, los factores de riesgo relacionados con el estilo de vida y los antecedentes familiares de enfermedades. Psicoterapia El mdico puede hacerle preguntas sobre lo siguiente:  Consumo de alcohol.  Consumo de tabaco.  Consumo de drogas.  [place] familiar y en las relaciones.  Actividad sexual.  Hbitos de alimentacin.  Trabajo y entorno  laboral.  Mtodos anticonceptivos.  El ciclo menstrual.  Antecedentes de embarazos. Pruebas de deteccin Pueden hacerle las siguientes pruebas o mediciones:  Estatura, peso e ndice de masa corporal [facility]).  La presin arterial.  Niveles de lpidos y colesterol. Estos se pueden controlar cada 5aos o con mayor frecuencia si tiene ms de 50aos.  Controles de la piel.  Pruebas de deteccin de cncer de pulmn. Pueden hacerle estas pruebas todos los aos a partir de los 55aos si ha fumado durante 30aos un paquete diario y sigue fumando o dej el hbito en algn momento en los ltimos 15aos.  Prueba de Personnel officer en las heces [facility]). Pueden hacerle esta prueba todos los aos a partir de [place].  Sigmoidoscopia flexible o colonoscopia. Pueden hacerle una sigmoidoscopia cada 72aos o una colonoscopia cada 10aos a partir de los 50aos.  Anlisis de sangre de hepatitisC.  Anlisis de sangre de hepatitisB.  Pruebas de deteccin de enfermedades de transmisin sexual (ETS).  Pruebas de deteccin de la diabetes. Para realizarlas, se controla el nivel de azcar en la sangre (glucemia) despus de que haya pasado un rato sin comer (ayuno). Pueden hacerle esta prueba cada 1 a 3aos.  Mamografa. Pueden hacerle este estudio cada 1 o 2aos. Hable con el mdico acerca de cundo debe comenzar a Nutritional therapist. Esto depende de si tiene antecedentes familiares de cncer de mama.  Pruebas de deteccin de tumores malignos relacionados con el BRCA. Se pueden realizar si tiene antecedentes familiares de cncer de mama, de ovarios, de trompas o de peritoneo.  Examen plvico y [place] de  Papanicolaou. Se pueden realizar cada 3aos, a partir de los 21aos. A partir de los 30aos, se pueden realizar cada 5aos si le hacen una prueba de Papanicolaou en combinacin con un anlisis del virus del Engineer, technical sales (VPH).  Densitometra sea. Se realiza para detectar la  presencia de osteoporosis. Pueden hacerle este estudio si corre un alto riesgo de tener osteoporosis. Hable con el mdico [employer], las opciones de tratamiento y, si es necesario, la necesidad de Optometrist ms [place]. Vacunas El mdico puede recomendarle que se aplique algunas vacunas, por ejemplo:  [place] antigripal. Se recomienda su aplicacin todos los aos.  Vacuna contra la difteria, el ttanos y Research officer, trade union (Tdap, Td). Puede que tenga que aplicarse un refuerzo contra el ttanos y la difteria (Td) cada 10aos.  Vacuna contra la varicela. Es posible que tenga que aplicrsela si no recibi esta vacuna.  Vacuna contra el herpes zster. Tal vez deba aplicrsela despus de los 60aos.  Vacuna contra el sarampin, la rubola y las paperas ([place]). Tal vez deba aplicarse al menos una dosis de SRP si naci [phone] o despus de ese ao. Podra tambin necesitar una segunda dosis.  Vacuna antineumoccica conjugada 13valente (PCV13). Puede necesitar esta vacuna si tiene determinadas enfermedades y no se vacun anteriormente.  Vacuna antineumoccica de polisacridos (PPSV23). Quizs tenga que aplicarse una o dos dosis si fuma o si sufre determinadas enfermedades.  Vacuna antimeningoccica. Puede necesitar esta vacuna si tiene determinadas enfermedades.  Vacuna contra la hepatitis A. Es posible que tenga que aplicarse esta vacuna si tiene determinadas enfermedades, o si trabaja en lugares donde puede estar expuesta a la hepatitisA o viaja a estas zonas.  Vacuna contra la hepatitis B. Es posible que tenga que aplicarse esta vacuna si tiene determinadas enfermedades, o si trabaja en lugares donde puede estar expuesta a la hepatitisB o viaja a estas zonas.  Vacuna antihaemophilus influenzae tipoB (Hib). Puede necesitar esta vacuna si tiene determinadas enfermedades. Hable con el [place] deteccin y las vacunas que tiene que [place], y con  qu frecuencia debe hacerlo. Esta informacin no tiene Marine scientist el consejo del mdico. Asegrese de hacerle al mdico cualquier pregunta que tenga. Document Reviewed: 08/26/2015 Elsevier Interactive Patient Education  2017 [employer].

## 2017-08-11 NOTE — Progress Notes (Signed)
Subjective:    Patient ID: Connie Li, female    DOB: 1976/09/12, 41 y.o.   MRN: [id number]  HPI  Pt in for follow up. Updates me on last visit but also wants physical  Pt states that her back pain is less than in past and less frequent. Pt did respond to diclofenac and flexeril when I rx'd that. Pt has about one week left of both meds since severe level pain had occurred.  X-ray was negative. Pain was thoracic.  Pt won't get pap today(will refer to gyn).  Pt declines flu vaccine   Review of Systems  Constitutional: Negative for chills, fatigue and fever.  HENT: Negative for congestion, drooling, facial swelling, mouth sores, postnasal drip, rhinorrhea, sinus pain and sinus pressure.   Respiratory: Negative for cough, choking, shortness of breath and wheezing.   Cardiovascular: Negative for chest pain and palpitations.  Gastrointestinal: Negative for abdominal pain, anal bleeding, blood in stool, constipation, diarrhea, rectal pain and vomiting.  Genitourinary: Negative for dysuria, enuresis, flank pain, frequency, genital sores, hematuria, pelvic pain, urgency, vaginal bleeding and vaginal pain.  Musculoskeletal: Negative for back pain, gait problem and myalgias.       Better back pain presently.  Skin: Negative for rash.  Neurological: Negative for dizziness, syncope, weakness, numbness and headaches.  Hematological: Negative for adenopathy. Does not bruise/bleed easily.  Psychiatric/Behavioral: Negative for behavioral problems, confusion, dysphoric mood, sleep disturbance and suicidal ideas. The patient is not nervous/anxious.    Past Medical History:  Diagnosis Date  . Allergy    more in spring and some in fall.  . Anxiety   . Depression      Social History   Social History  . Marital status: Married    Spouse name: N/A  . Number of children: N/A  . Years of education: N/A   Occupational History  . Not on file.   Social History Main Topics  . Smoking status:  Never Smoker  . Smokeless tobacco: Never Used  . Alcohol use No  . Drug use: No  . Sexual activity: No   Other Topics Concern  . Not on file   Social History Narrative  . No narrative on file    Past Surgical History:  Procedure Laterality Date  . CESAREAN SECTION      No family history on file.  No Known Allergies  Current Outpatient Prescriptions on File Prior to Visit  Medication Sig Dispense Refill  . cyclobenzaprine (FLEXERIL) 5 MG tablet 1 tab po q hs as needed back pain or trapezius pain 14 tablet 1  . diclofenac (VOLTAREN) 75 MG EC tablet Take 1 tablet (75 mg total) by mouth 2 (two) times daily. 30 tablet 0   No current facility-administered medications on file prior to visit.     BP 107/70   Pulse 67   Temp 99.2 F (37.3 C) (Oral)   Resp 16   Ht  (1.6 m)   Wt 150 lb (68 kg)   LMP 07/27/2017   SpO2 100%   BMI 26.57 kg/m      Objective:   Physical Exam  General Mental Status- Alert. General Appearance- Not in acute distress.   Skin General: Color- Normal Color. Moisture- Normal Moisture.  Neck Carotid Arteries- Normal color. Moisture- Normal Moisture. No carotid bruits. No JVD.  Chest and Lung Exam Auscultation: Breath Sounds:-Normal.  Cardiovascular Auscultation:Rythm- Regular. Murmurs & Other Heart Sounds:Auscultation of the heart reveals- No Murmurs.  Abdomen Inspection:-Inspeection Normal. Palpation/Percussion:Note:No mass.  Palpation and Percussion of the abdomen reveal- Non Tender, Non Distended + BS, no rebound or guarding.    Neurologic Cranial Nerve exam:- CN III-XII intact(No nystagmus), symmetric smile. Normal/Intact Strength:- 5/5 equal and symmetric strength both upper and lower extremities.      Assessment & Plan:  For you wellness exam today I have ordered cbc, cmp, tsh, lipid panel, ua, b12, vit d and hiv.   Vaccine declined.(can reconsider and if you change mind let us know) flu vaccine and tdap  Recommend  exercise and healthy diet.  We will let you know lab results as they come in.  Follow up date appointment will be determined after lab review.

## 2017-08-15 ENCOUNTER — Telehealth: Payer: Self-pay | Admitting: Medical

## 2017-08-15 ENCOUNTER — Ambulatory Visit: Payer: BLUE CROSS/BLUE SHIELD | Admitting: Medical

## 2017-08-15 ENCOUNTER — Other Ambulatory Visit (INDEPENDENT_AMBULATORY_CARE_PROVIDER_SITE_OTHER): Payer: BLUE CROSS/BLUE SHIELD

## 2017-08-15 DIAGNOSIS — R8290 Unspecified abnormal findings in urine: Secondary | ICD-10-CM | POA: Diagnosis not present

## 2017-08-15 DIAGNOSIS — Z0000 Encounter for general adult medical examination without abnormal findings: Secondary | ICD-10-CM | POA: Diagnosis not present

## 2017-08-15 DIAGNOSIS — R5383 Other fatigue: Secondary | ICD-10-CM | POA: Diagnosis not present

## 2017-08-15 DIAGNOSIS — R319 Hematuria, unspecified: Secondary | ICD-10-CM

## 2017-08-15 LAB — POC URINALSYSI DIPSTICK (AUTOMATED)
BILIRUBIN UA: NEGATIVE
Glucose, UA: NEGATIVE
KETONES UA: NEGATIVE
Leukocytes, UA: NEGATIVE
Nitrite, UA: NEGATIVE
Protein, UA: NEGATIVE
Urobilinogen, UA: 0.2 E.U./dL
pH, UA: 6 (ref 5.0–8.0)

## 2017-08-15 NOTE — Addendum Note (Signed)
Addended by: Verdie Shire on: 08/15/2017 04:51 PM   Modules accepted: Orders

## 2017-08-15 NOTE — Telephone Encounter (Signed)
Future urine order placed. °

## 2017-08-15 NOTE — Telephone Encounter (Signed)
LVM for pt to return call. From labs (Angie) wants to know when was pt last menstrual cycle.

## 2017-08-16 LAB — URINE CULTURE
MICRO NUMBER: 81117386
RESULT: NO GROWTH
SPECIMEN QUALITY: ADEQUATE

## 2017-08-16 NOTE — Telephone Encounter (Signed)
Called pt and LVM for pt to return call.

## 2017-08-16 NOTE — Telephone Encounter (Signed)
-----   Message from Edward Saguier, PA-C sent at 08/15/2017  8:38 PM EDT ----- °Pt had some blood in her urine. Was she close to menstrual cycle at time she gave urine sample. Would like to repeat her  Urine sometimes in next month(when not on or near cycle). I will put in future urine order so she can get that done. °

## 2017-08-17 LAB — VITAMIN D 1,25 DIHYDROXY
Vitamin D 1, 25 (OH)2 Total: 37 pg/mL (ref 18–72)
Vitamin D2 1, 25 (OH)2: <8 pg/mL
Vitamin D3 1, 25 (OH)2: 37 pg/mL

## 2017-08-18 ENCOUNTER — Telehealth: Payer: Self-pay | Admitting: Medical

## 2017-08-18 NOTE — Telephone Encounter (Signed)
-----   Message from Esperanza Richters, PA-C sent at 08/15/2017  8:38 PM EDT ----- Pt had some blood in her urine. Was she close to menstrual cycle at time she gave urine sample. Would like to repeat her  Urine sometimes in next month(when not on or near cycle). I will put in future urine order so she can get that done.

## 2017-08-18 NOTE — Telephone Encounter (Signed)
Informed pt's spouse (spouse on [place]) about her results and had pt schedule for her labs to be done in November.

## 2017-08-28 ENCOUNTER — Telehealth: Payer: Self-pay | Admitting: Medical

## 2017-08-28 NOTE — Telephone Encounter (Signed)
I had placed future fasting labs for her to do. But she has not done those yet. Will you call and ask if she intends to get those done? If not then I will cancel those out.

## 2017-08-30 NOTE — Telephone Encounter (Signed)
Called pt and VM full, could not leave message.

## 2017-09-26 ENCOUNTER — Other Ambulatory Visit: Payer: BLUE CROSS/BLUE SHIELD

## 2019-03-29 IMAGING — DX DG THORACIC SPINE 2V
3 series · 3 of 3 positions shown · non-contrast
Comparison: None.

CLINICAL DATA: Mid back pain for 2 weeks.

EXAM:
THORACIC SPINE 2 VIEWS

[t-spine ap]
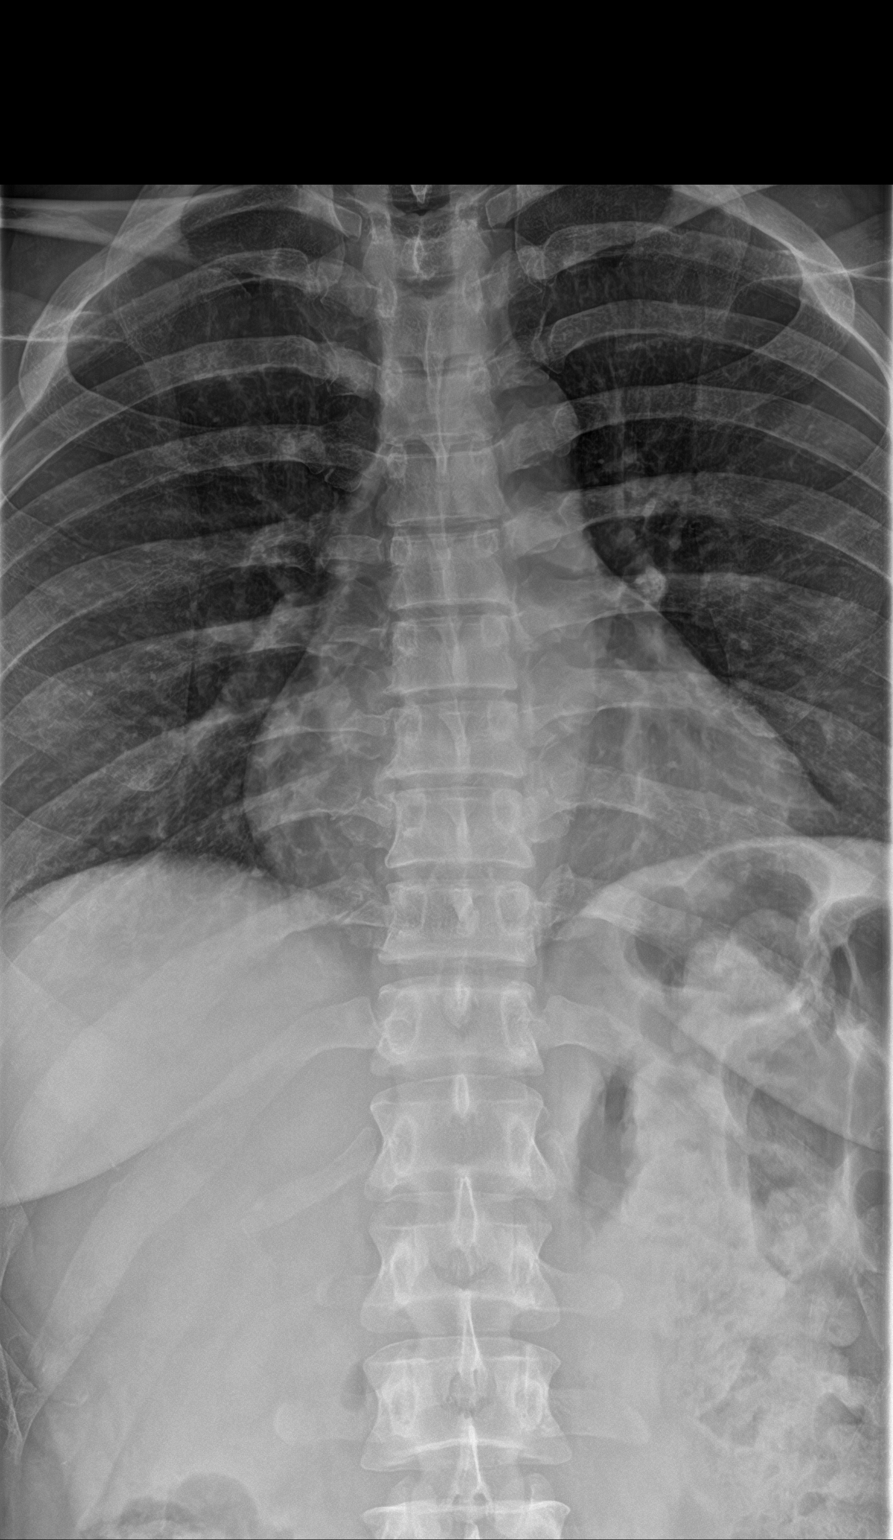

[t-spine lat]
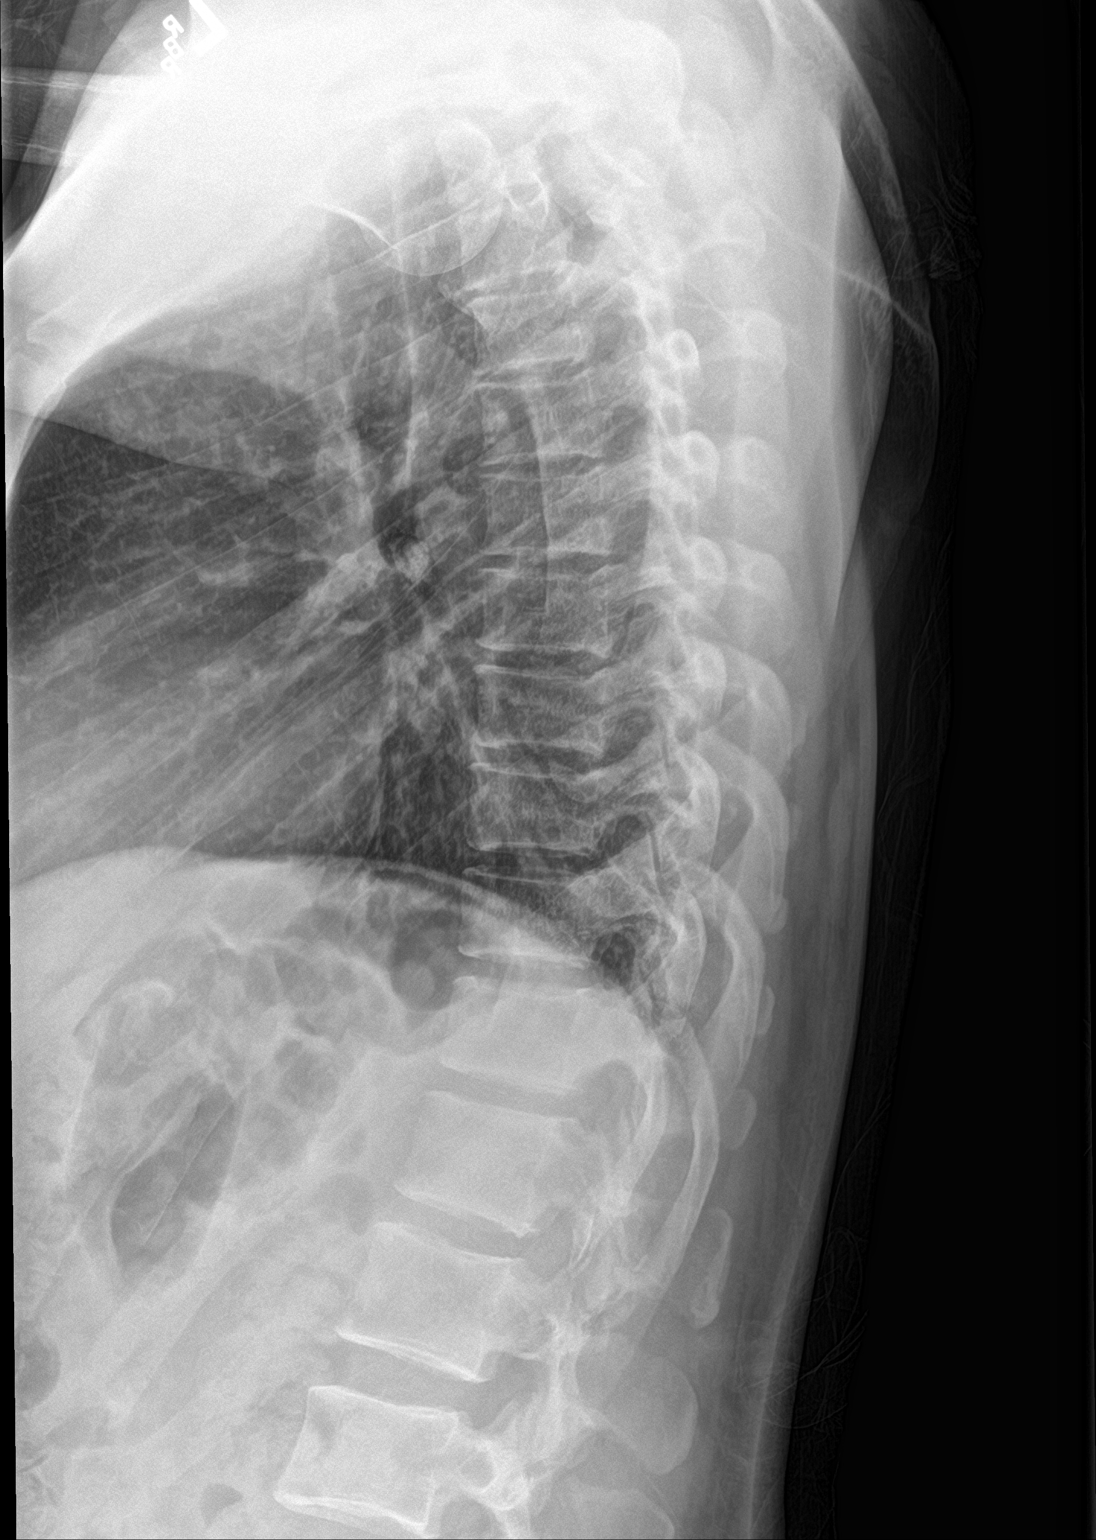

[t-spine swimmers]
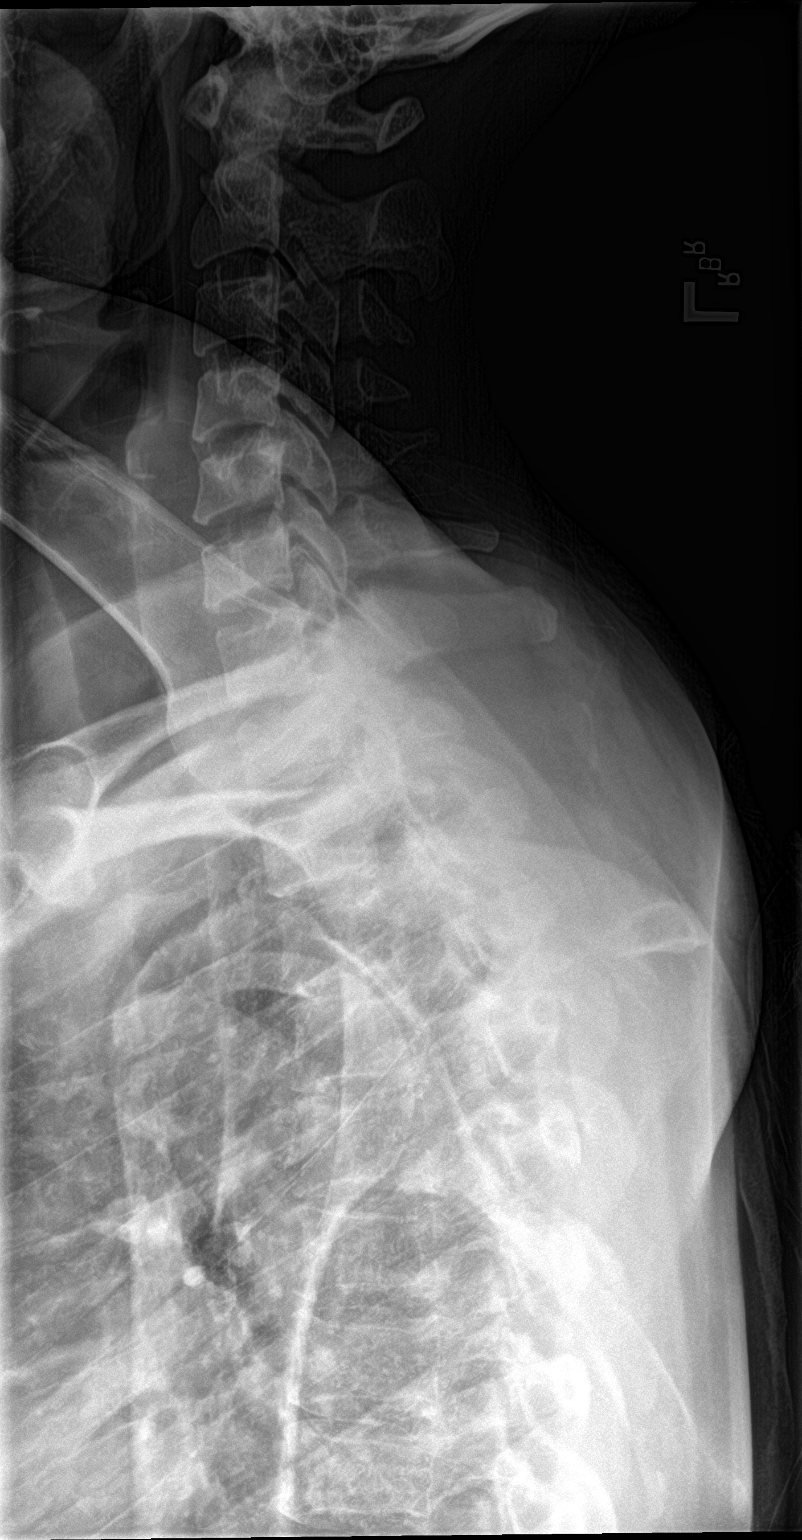

[3 of 3 positions shown; findings below may reference images not displayed]

FINDINGS: There is no evidence of thoracic spine fracture. Alignment is
normal. No other significant bone abnormalities are identified.
IMPRESSION: Negative.

## 2019-04-04 DIAGNOSIS — H524 Presbyopia: Secondary | ICD-10-CM | POA: Diagnosis not present

## 2019-04-04 DIAGNOSIS — H5203 Hypermetropia, bilateral: Secondary | ICD-10-CM | POA: Diagnosis not present

## 2019-04-04 DIAGNOSIS — H52223 Regular astigmatism, bilateral: Secondary | ICD-10-CM | POA: Diagnosis not present

## 2019-05-24 ENCOUNTER — Ambulatory Visit
Admission: RE | Admit: 2019-05-24 | Discharge: 2019-05-24 | Disposition: A | Discharge status: Home / Self Care | Payer: BC Managed Care – PPO | Source: Ambulatory Visit | Attending: Nurse Practitioner | Admitting: Nurse Practitioner

## 2019-05-24 ENCOUNTER — Other Ambulatory Visit: Payer: Self-pay

## 2019-05-24 ENCOUNTER — Other Ambulatory Visit: Payer: Self-pay | Admitting: Nurse Practitioner

## 2019-05-24 DIAGNOSIS — M549 Dorsalgia, unspecified: Secondary | ICD-10-CM

## 2019-05-24 DIAGNOSIS — R6883 Chills (without fever): Secondary | ICD-10-CM

## 2019-05-24 DIAGNOSIS — R079 Chest pain, unspecified: Secondary | ICD-10-CM | POA: Diagnosis not present

## 2019-05-24 DIAGNOSIS — Z20828 Contact with and (suspected) exposure to other viral communicable diseases: Secondary | ICD-10-CM | POA: Diagnosis not present
